# Patient Record
Sex: Female | Born: 2000 | Race: Black or African American | Hispanic: No | Marital: Single | Smoking: Never smoker
Health system: Southern US, Community
[De-identification: ages and names within clinical notes are randomized; demographics above are authoritative.]

## PROBLEM LIST (undated history)

## (undated) DIAGNOSIS — I10 Essential (primary) hypertension: Secondary | ICD-10-CM

## (undated) HISTORY — DX: Essential (primary) hypertension: I10

---
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
## 2019-05-13 ENCOUNTER — Encounter (HOSPITAL_COMMUNITY): Payer: Self-pay

## 2019-05-13 ENCOUNTER — Emergency Department (HOSPITAL_COMMUNITY)
Admission: EM | Admit: 2019-05-13 | Discharge: 2019-05-13 | Disposition: A | Discharge status: Home / Self Care | Payer: BC Managed Care – PPO | Attending: Emergency Medicine | Admitting: Emergency Medicine

## 2019-05-13 ENCOUNTER — Ambulatory Visit (INDEPENDENT_AMBULATORY_CARE_PROVIDER_SITE_OTHER)
Admission: EM | Admit: 2019-05-13 | Discharge: 2019-05-13 | Disposition: A | Discharge status: Home / Self Care | Payer: BC Managed Care – PPO | Source: Home / Self Care | Attending: Emergency Medicine | Admitting: Emergency Medicine

## 2019-05-13 ENCOUNTER — Other Ambulatory Visit: Payer: Self-pay

## 2019-05-13 DIAGNOSIS — R1031 Right lower quadrant pain: Secondary | ICD-10-CM | POA: Insufficient documentation

## 2019-05-13 DIAGNOSIS — N730 Acute parametritis and pelvic cellulitis: Secondary | ICD-10-CM | POA: Diagnosis not present

## 2019-05-13 DIAGNOSIS — Z5321 Procedure and treatment not carried out due to patient leaving prior to being seen by health care provider: Secondary | ICD-10-CM | POA: Insufficient documentation

## 2019-05-13 DIAGNOSIS — Z3202 Encounter for pregnancy test, result negative: Secondary | ICD-10-CM

## 2019-05-13 LAB — POCT URINALYSIS DIP (DEVICE)
Bilirubin Urine: NEGATIVE
Glucose, UA: NEGATIVE mg/dL
Hgb urine dipstick: NEGATIVE
Ketones, ur: NEGATIVE mg/dL
Leukocytes,Ua: NEGATIVE
Nitrite: NEGATIVE
Protein, ur: NEGATIVE mg/dL
Specific Gravity, Urine: >=1.03 (ref 1.005–1.030)
Urobilinogen, UA: 4 mg/dL — ABNORMAL HIGH (ref 0.0–1.0)
pH: 6 (ref 5.0–8.0)

## 2019-05-13 LAB — POCT PREGNANCY, URINE: Preg Test, Ur: NEGATIVE

## 2019-05-13 MED ORDER — SODIUM CHLORIDE 0.9% FLUSH: 3.0000 mL | Freq: Once | INTRAVENOUS | Status: DC

## 2019-05-13 MED ORDER — IBUPROFEN 600 MG PO TABS: 600.0000 mg | ORAL_TABLET | Freq: Four times a day (QID) | ORAL | 0 refills | Status: AC | PRN

## 2019-05-13 MED ORDER — METRONIDAZOLE 500 MG PO TABS: 500.0000 mg | ORAL_TABLET | Freq: Two times a day (BID) | ORAL | 0 refills | Status: AC

## 2019-05-13 MED ORDER — DOXYCYCLINE HYCLATE 100 MG PO CAPS: 100.0000 mg | ORAL_CAPSULE | Freq: Two times a day (BID) | ORAL | 0 refills | Status: AC

## 2019-05-13 NOTE — ED Triage Notes (Signed)
Pt states she has abdominal cramps pt states she went to [facility] ER today. Pt needs a work note.

## 2019-05-13 NOTE — Discharge Instructions (Signed)
We have sent off testing for gonorrhea, chlamydia, HIV, syphilis, trichomonas, BV and yeast.  I am treating you for chlamydia and bacterial vaginosis with the doxycycline and Flagyl.  Because you have such a severe allergic reaction with penicillins, we will need to wait and see if the gonorrhea test comes back positive, in which case you will need to be treated with Rocephin which is similar to, but is not identical to penicillin.  Y Dr. Megan Salon will see you in his office for follow-up and for lab results.  He will give you the medication if you need it and he may do allergy testing to see if you are truly allergic to cephalosporins or not.  Someone should be contacting you in the next day or so to arrange this appointment, however if you do not hear from them, call them and arrange an appointment for early next week.  You may take 600 mg ibuprofen combined with 1 g of Tylenol 3-4 times a day as needed for pain.  Immediately to the ER if your abdominal pain changes, gets worse, fevers above 100.4, for pain not controlled with a Tylenol/ibuprofen combination, or for any other concerns.

## 2019-05-13 NOTE — ED Triage Notes (Signed)
Pt arrived with right lower abdominal pain that started roughly an hour ago. States she also has a headache and nausea. States the pain started after urination.

## 2019-05-13 NOTE — ED Provider Notes (Addendum)
HPI  SUBJECTIVE:  Shirley Miller is a 18 y.o. female who presents with constant, worsening stabbing sharp right lower quadrant pain starting yesterday.  She states it radiated to the left lower quadrant last night but has re-localized to the right lower quadrant.  She reports nausea, odorous vaginal discharge.  Her last bowel movement was 2 to 3 days ago.  This is a normal stooling pattern for her.  No vomiting, fevers, anorexia, abdominal distention.  She ate dinner without any problem.  No dysuria, urgency, frequency, cloudy or odorous urine, hematuria.  No vaginal bleeding, genital rash, vaginal itching.  She is in a relatively new monogamous sexual relationship with a female who is asymptomatic, STDs are not a concern today.  She has been taking 650 mg of Tylenol and 800 mg of ibuprofen without improvement in her symptoms.  No antipyretic in the past 4 to 6 hours.  Symptoms are worse with urination and taking deep breaths.  It is not associated with eating, drinking, walking.  She states that the car ride over here was not painful.  She has a past medical history of hypertension, HSV, BV, yeast.  No history of PID, diabetes, gonorrhea, chlamydia, HIV, syphilis, trichomonas, PCOS, ovarian torsion, appendicitis.  LMP: 9/3.  She is unsure if she could be pregnant, states that her last period was irregular.  PMD: Is not in [place].  Patient was in the [place] long ER earlier today with right lower abdominal pain, headache and nausea.  Left before labs could be done.  System, Pcp Not In  Past Medical History:  Diagnosis Date  . Hypertension     History reviewed. No pertinent surgical history.  No family history on file.  Social History   Tobacco Use  . Smoking status: Never Smoker  . Smokeless tobacco: Never Used  Substance Use Topics  . Alcohol use: Never    Frequency: Never  . Drug use: Never    No current facility-administered medications for this encounter.   Current Outpatient  Medications:  .  doxycycline (VIBRAMYCIN) 100 MG capsule, Take 1 capsule (100 mg total) by mouth 2 (two) times daily for 14 days., Disp: 28 capsule, Rfl: 0 .  ibuprofen (ADVIL) 600 MG tablet, Take 1 tablet (600 mg total) by mouth every 6 (six) hours as needed., Disp: 30 tablet, Rfl: 0 .  metroNIDAZOLE (FLAGYL) 500 MG tablet, Take 1 tablet (500 mg total) by mouth 2 (two) times daily for 14 days., Disp: 28 tablet, Rfl: 0  Allergies  Allergen Reactions  . Latex Swelling  . Penicillins Swelling     ROS  As noted in HPI.   Physical Exam  BP (!) 143/92 (BP Location: Right Arm)   Pulse 66   Temp 98.1 F (36.7 C) (Temporal)   Resp 18   Wt 97.5 kg   LMP 04/19/2019   SpO2 98%   Constitutional: Well developed, well nourished, no acute distress Eyes:  EOMI, conjunctiva normal bilaterally HENT: Normocephalic, atraumatic,mucus membranes moist Respiratory: Normal inspiratory effort Cardiovascular: Normal rate GI: Normal appearance, soft, right lower quadrant tenderness, no guarding, rebound.  No masses, active bowel sounds.  Negative tap table test.  Negative Murphy.  Negative Rovsing. Back: No CVAT. GU: Normal external genitalia.  Positive thin white odorous vaginal discharge.  Normal os, normal vaginal mucosa.  Uterus smooth, nontender, no CMT.  Positive bilateral adnexal tenderness.  No adnexal masses.  Chaperone present during exam. skin: No rash, skin intact Musculoskeletal: no deformities Neurologic: Alert &  oriented x 3, no focal neuro deficits Psychiatric: Speech and behavior appropriate   ED Course   Medications - No data to display  Orders Placed This Encounter  Procedures  . RPR    Standing Status:   Standing    Number of Occurrences:   1  . HIV antibody    Standing Status:   Standing    Number of Occurrences:   1  . Pregnancy, urine POC    Standing Status:   Standing    Number of Occurrences:   1  . POCT urinalysis dip (device)    Standing Status:   Standing     Number of Occurrences:   1    Results for orders placed or performed during the hospital encounter of 05/13/19 (from the past 24 hour(s))  POCT urinalysis dip (device)     Status: Abnormal   Collection Time: 05/13/19  8:32 PM  Result Value Ref Range   Glucose, UA NEGATIVE NEGATIVE mg/dL   Bilirubin Urine NEGATIVE NEGATIVE   Ketones, ur NEGATIVE NEGATIVE mg/dL   Specific Gravity, Urine >=1.030 1.005 - 1.030   Hgb urine dipstick NEGATIVE NEGATIVE   pH 6.0 5.0 - 8.0   Protein, ur NEGATIVE NEGATIVE mg/dL   Urobilinogen, UA 4.0 (H) 0.0 - 1.0 mg/dL   Nitrite NEGATIVE NEGATIVE   Leukocytes,Ua NEGATIVE NEGATIVE  Pregnancy, urine POC     Status: None   Collection Time: 05/13/19  8:37 PM  Result Value Ref Range   Preg Test, Ur NEGATIVE NEGATIVE   No results found.  ED Clinical Impression  1. PID (acute pelvic inflammatory disease)      ED Assessment/Plan  ER records reviewed.  As noted in HPI.  Presentation concerning for PID.  She is not pregnant.  She does not have a urinary tract infection although she does have some urobilinogen in her urine dip.  In the differential is ruptured ovarian cyst, early appendicitis.  Her abdomen is benign other than the bilateral adnexal and right lower quadrant tenderness.  She has no peritoneal signs.  Doubt torsion, TOA.   Sent off gonorrhea, chlamydia, HIV, RPR, trichomonas, yeast, BV.  Discussed case with Dr. Michel Bickers, infectious disease on-call, will send her home with doxycycline and Flagyl.  She reports anaphylaxis requiring ER treatment with penicillins less than 10 years ago, so we will wait on the labs prior to seeing if she needs treatment for that.  He will follow-up with her in the infectious disease clinic early next week and administer the Rocephin and a test dose if necessary.  We will CC him on today's visit.  Home also with ibuprofen.  Gave her strict ER return precautions.  Discussed labs,  MDM, treatment plan, and plan for  follow-up with patient. Discussed sn/sx that should prompt return to the ED. patient agrees with plan.   Meds ordered this encounter  Medications  . metroNIDAZOLE (FLAGYL) 500 MG tablet    Sig: Take 1 tablet (500 mg total) by mouth 2 (two) times daily for 14 days.    Dispense:  28 tablet    Refill:  0  . doxycycline (VIBRAMYCIN) 100 MG capsule    Sig: Take 1 capsule (100 mg total) by mouth 2 (two) times daily for 14 days.    Dispense:  28 capsule    Refill:  0  . ibuprofen (ADVIL) 600 MG tablet    Sig: Take 1 tablet (600 mg total) by mouth every 6 (six) hours as needed.  Dispense:  30 tablet    Refill:  0    *This clinic note was created using Scientist, clinical (histocompatibility and immunogenetics)Dragon dictation software. Therefore, there may be occasional mistakes despite careful proofreading.   ?   Domenick GongMortenson, Shelbi Vaccaro, MD 05/13/19 2117    Domenick GongMortenson, Lauralynn Loeb, MD 05/14/19 1022

## 2019-05-13 NOTE — ED Notes (Signed)
Called pt from lobby for lab workk and recheck of V/S No response X1

## 2019-05-14 ENCOUNTER — Telehealth: Payer: Self-pay

## 2019-05-14 LAB — RPR: RPR Ser Ql: NONREACTIVE

## 2019-05-14 LAB — HIV ANTIBODY (ROUTINE TESTING W REFLEX): HIV Screen 4th Generation wRfx: NONREACTIVE

## 2019-05-14 NOTE — Telephone Encounter (Signed)
Spoke with patient and scheduled next week. Patient provided with location/phone.  Shirley Miller

## 2019-05-14 NOTE — Telephone Encounter (Signed)
-----   Message from Michel Bickers, MD sent at 05/14/2019  1:21 PM EDT ----- Please see if Ms. Shirley Miller can come see me in my new patient slot on Monday afternoon.  She was seen in the emergency room last night with possible PID.  If she cannot come on Monday please schedule her at my next available appointment.  Thanks.Jenny Reichmann

## 2019-05-14 NOTE — Telephone Encounter (Signed)
Much thanks.

## 2019-05-17 ENCOUNTER — Telehealth (HOSPITAL_COMMUNITY): Payer: Self-pay | Admitting: Emergency Medicine

## 2019-05-17 LAB — CERVICOVAGINAL ANCILLARY ONLY
Bacterial Vaginitis (gardnerella): POSITIVE — AB
Candida Glabrata: NEGATIVE
Candida Vaginitis: POSITIVE — AB
Chlamydia: NEGATIVE
Neisseria Gonorrhea: NEGATIVE
Trichomonas: NEGATIVE

## 2019-05-17 MED ORDER — FLUCONAZOLE 150 MG PO TABS: 150.0000 mg | ORAL_TABLET | Freq: Once | ORAL | 0 refills | Status: AC

## 2019-05-17 NOTE — Telephone Encounter (Signed)
Test for candida (yeast) was positive.  Prescription for fluconazole 150mg po now, repeat dose in 3d if needed, #2 no refills, sent to the pharmacy of record.  Recheck or followup with PCP for further evaluation if symptoms are not improving.    Bacterial Vaginosis test is positive.  Prescription for metronidazole was given at the urgent care visit.   Patient contacted and made aware of    results, all questions answered   

## 2019-05-21 ENCOUNTER — Encounter (HOSPITAL_COMMUNITY): Payer: Self-pay | Admitting: Emergency Medicine

## 2019-05-21 ENCOUNTER — Ambulatory Visit (INDEPENDENT_AMBULATORY_CARE_PROVIDER_SITE_OTHER)
Admission: EM | Admit: 2019-05-21 | Discharge: 2019-05-21 | Disposition: A | Discharge status: Home / Self Care | Payer: BC Managed Care – PPO | Source: Home / Self Care

## 2019-05-21 ENCOUNTER — Other Ambulatory Visit: Payer: Self-pay

## 2019-05-21 ENCOUNTER — Emergency Department (HOSPITAL_COMMUNITY)
Admission: AD | Admit: 2019-05-21 | Discharge: 2019-05-22 | Disposition: A | Discharge status: Home / Self Care | Payer: BC Managed Care – PPO | Attending: Emergency Medicine | Admitting: Emergency Medicine

## 2019-05-21 DIAGNOSIS — I10 Essential (primary) hypertension: Secondary | ICD-10-CM | POA: Diagnosis not present

## 2019-05-21 DIAGNOSIS — N83201 Unspecified ovarian cyst, right side: Secondary | ICD-10-CM | POA: Insufficient documentation

## 2019-05-21 DIAGNOSIS — R1031 Right lower quadrant pain: Secondary | ICD-10-CM

## 2019-05-21 LAB — POCT URINALYSIS DIP (DEVICE)
Bilirubin Urine: NEGATIVE
Glucose, UA: NEGATIVE mg/dL
Hgb urine dipstick: NEGATIVE
Ketones, ur: NEGATIVE mg/dL
Leukocytes,Ua: NEGATIVE
Nitrite: NEGATIVE
Protein, ur: NEGATIVE mg/dL
Specific Gravity, Urine: >=1.03 (ref 1.005–1.030)
Urobilinogen, UA: 0.2 mg/dL (ref 0.0–1.0)
pH: 6 (ref 5.0–8.0)

## 2019-05-21 LAB — COMPREHENSIVE METABOLIC PANEL
ALT: 22 U/L (ref 0–44)
AST: 19 U/L (ref 15–41)
Albumin: 3.7 g/dL (ref 3.5–5.0)
Alkaline Phosphatase: 52 U/L (ref 38–126)
Anion gap: 8 (ref 5–15)
BUN: 8 mg/dL (ref 6–20)
CO2: 23 mmol/L (ref 22–32)
Calcium: 9.5 mg/dL (ref 8.9–10.3)
Chloride: 109 mmol/L (ref 98–111)
Creatinine, Ser: 0.78 mg/dL (ref 0.44–1.00)
GFR calc Af Amer: >60 mL/min (ref >60)
GFR calc non Af Amer: >60 mL/min (ref >60)
Glucose, Bld: 97 mg/dL (ref 70–99)
Potassium: 3.9 mmol/L (ref 3.5–5.1)
Sodium: 140 mmol/L (ref 135–145)
Total Bilirubin: 0.3 mg/dL (ref 0.3–1.2)
Total Protein: 6.8 g/dL (ref 6.5–8.1)

## 2019-05-21 LAB — CBC
HCT: 38.3 % (ref 36.0–46.0)
Hemoglobin: 11.8 g/dL — ABNORMAL LOW (ref 12.0–15.0)
MCH: 25.7 pg — ABNORMAL LOW (ref 26.0–34.0)
MCHC: 30.8 g/dL (ref 30.0–36.0)
MCV: 83.4 fL (ref 80.0–100.0)
Platelets: 257 10*3/uL (ref 150–400)
RBC: 4.59 MIL/uL (ref 3.87–5.11)
RDW: 13.7 % (ref 11.5–15.5)
WBC: 5.6 10*3/uL (ref 4.0–10.5)
nRBC: 0 % (ref 0.0–0.2)

## 2019-05-21 LAB — I-STAT BETA HCG BLOOD, ED (MC, WL, AP ONLY): I-stat hCG, quantitative: <5 m[IU]/mL (ref <5)

## 2019-05-21 LAB — POCT PREGNANCY, URINE: Preg Test, Ur: NEGATIVE

## 2019-05-21 LAB — LIPASE, BLOOD: Lipase: 41 U/L (ref 11–51)

## 2019-05-21 MED ORDER — SODIUM CHLORIDE 0.9% FLUSH: 3.0000 mL | Freq: Once | INTRAVENOUS | Status: DC

## 2019-05-21 NOTE — Discharge Instructions (Addendum)
Go to ER for further evaluation of abdominal pain.

## 2019-05-21 NOTE — ED Triage Notes (Addendum)
C/o lower abd pain x 1 1/2 weeks with nausea.  Pt was sent from [facility] to MAU and then to ED after urine preg test was negative.

## 2019-05-21 NOTE — MAU Note (Signed)
PT SAYS SHE HAS ABD PAIN - X1-2 WEEKS .  TOLD TO GO TO ER.  TOOK IBUPROFEN FOR PAIN  DENIES FEVER - NO BLEEDING .

## 2019-05-21 NOTE — ED Triage Notes (Addendum)
Pt is here for continued lower abdominal cramping.  She was seen here 8 days ago and was prescribed several medications that she states she is taking as prescribed and still has about 5 days left.  She states the pain is not getting any better.  Pt requesting another pregnancy test today.

## 2019-05-21 NOTE — MAU Provider Note (Signed)
First Provider Initiated Contact with Patient 05/21/19 1934      S Ms. Shirley Miller is a 18 y.o. [id number] non-pregnant female who presents to MAU today with complaint of lower abdominal pain x 1.5 weeks. She was seen at Urgent care earlier today and had a negative UPT and was told to seek further evaluation in the ED and presented here. She denies vaginal bleeding or fever. She was just recently treated for PID.    O BP 129/71 (BP Location: Right Arm)   Pulse 83   Temp 97.6 F (36.4 C) (Oral)   Resp 20   Ht 5\' 6"  (1.676 m)   Wt 116.3 kg   LMP 04/15/2019   BMI 41.37 kg/m  Physical Exam  Nursing note and vitals reviewed. Constitutional: She is oriented to person, place, and time. She appears well-developed and well-nourished. No distress.  HENT:  Head: Normocephalic and atraumatic.  Cardiovascular: Normal rate.  Respiratory: Effort normal.  GI: Soft. She exhibits no distension.  Neurological: She is alert and oriented to person, place, and time.  Skin: Skin is warm and dry. No erythema.  Psychiatric: She has a normal mood and affect.   Results for orders placed or performed during the hospital encounter of 05/21/19 (from the past 24 hour(s))  POCT urinalysis dip (device)     Status: None   Collection Time: 05/21/19  1:43 PM  Result Value Ref Range   Glucose, UA NEGATIVE NEGATIVE mg/dL   Bilirubin Urine NEGATIVE NEGATIVE   Ketones, ur NEGATIVE NEGATIVE mg/dL   Specific Gravity, Urine >=1.030 1.005 - 1.030   Hgb urine dipstick NEGATIVE NEGATIVE   pH 6.0 5.0 - 8.0   Protein, ur NEGATIVE NEGATIVE mg/dL   Urobilinogen, UA 0.2 0.0 - 1.0 mg/dL   Nitrite NEGATIVE NEGATIVE   Leukocytes,Ua NEGATIVE NEGATIVE  Pregnancy, urine POC     Status: None   Collection Time: 05/21/19  1:56 PM  Result Value Ref Range   Preg Test, Ur NEGATIVE NEGATIVE    A Non pregnant female Medical screening exam started  Abdominal pain in a non-pregnant female   P Transfer to [facility] for further  evaluation RN has called report Dr. Blair Hailey was advised of the patient and accepts the transfer   Danielle Rankin 05/21/2019 7:36 PM

## 2019-05-21 NOTE — ED Notes (Signed)
Pt has to go get her boyfriend from work, will be back later.

## 2019-05-21 NOTE — ED Provider Notes (Signed)
[facility]    CSN: [id number] Arrival date & time: 05/21/19  1250      History   Chief Complaint Chief Complaint  Patient presents with  . Abdominal Pain    HPI Shirley Miller is a 18 y.o. female.   Patient here concerned with abdominal pain x ~1 week. She was seen here 10/1, previous chart reviewed.  She was treated for PID, given doxycycline and flagyl, which she continues to take.  Lab positive for BV and yeast infection, negative for GC/CT/trich.  She reports pain worsened over last week, 10/10 pain located primarily RLQ.  LMP > 4 weeks ago, patient unsure if she is pregnant.  She admits 1 episode of vomiting x yesterday, notes she's had "loose" stools. Denies dysuria, frequency, urgency, vaginal bleeding.  She notes vaginal d/c is improving since starting medication.       Past Medical History:  Diagnosis Date  . Hypertension     There are no active problems to display for this patient.   History reviewed. No pertinent surgical history.  OB History    Gravida  0   Para  0   Term  0   Preterm  0   AB  0   Living  0     SAB  0   TAB  0   Ectopic  0   Multiple  0   Live Births  0            Home Medications    Prior to Admission medications   Medication Sig Start Date End Date Taking? Authorizing Provider  doxycycline (VIBRAMYCIN) 100 MG capsule Take 1 capsule (100 mg total) by mouth 2 (two) times daily for 14 days. 05/13/19 05/27/19 Yes Domenick Gong, MD  ibuprofen (ADVIL) 600 MG tablet Take 1 tablet (600 mg total) by mouth every 6 (six) hours as needed. 05/13/19  Yes Domenick Gong, MD  metroNIDAZOLE (FLAGYL) 500 MG tablet Take 1 tablet (500 mg total) by mouth 2 (two) times daily for 14 days. 05/13/19 05/27/19 Yes Domenick Gong, MD    Family History History reviewed. No pertinent family history.  Social History Social History   Tobacco Use  . Smoking status: Never Smoker  . Smokeless tobacco: Never Used   Substance Use Topics  . Alcohol use: Never    Frequency: Never  . Drug use: Never     Allergies   Latex and Penicillins   Review of Systems Review of Systems  Constitutional: Negative for chills, fatigue and fever.  Gastrointestinal: Positive for abdominal pain, diarrhea, nausea and vomiting. Negative for abdominal distention, blood in stool and constipation.  Genitourinary: Positive for vaginal discharge. Negative for difficulty urinating, dysuria, flank pain, frequency, hematuria, pelvic pain, urgency, vaginal bleeding and vaginal pain.  Musculoskeletal: Negative for arthralgias, back pain and myalgias.  Skin: Negative for rash.  Allergic/Immunologic: Negative for immunocompromised state.  Neurological: Negative for light-headedness and headaches.  Hematological: Negative for adenopathy. Does not bruise/bleed easily.  Psychiatric/Behavioral: Positive for sleep disturbance. Negative for confusion.     Physical Exam Triage Vital Signs ED Triage Vitals  Enc Vitals Group     BP 05/21/19 1312 122/60     Pulse --      Resp 05/21/19 1312 16     Temp 05/21/19 1312 98.3 F (36.8 C)     Temp Source 05/21/19 1312 Oral     SpO2 05/21/19 1312 100 %     Weight --  Height --      Head Circumference --      Peak Flow --      Pain Score 05/21/19 1313 10     Pain Loc --      Pain Edu? --      Excl. in [place]? --    No data found.  Updated Vital Signs BP 122/60 (BP Location: Right Arm)   Temp 98.3 F (36.8 C) (Oral)   Resp 16   LMP 04/15/2019 (Exact Date)   SpO2 100%   Visual Acuity Right Eye Distance:   Left Eye Distance:   Bilateral Distance:    Right Eye Near:   Left Eye Near:    Bilateral Near:     Physical Exam Vitals signs and nursing note reviewed.  Constitutional:      General: She is not in acute distress.    Appearance: She is well-developed. She is not ill-appearing or toxic-appearing.  HENT:     Head: Normocephalic and atraumatic.  Eyes:      Conjunctiva/sclera: Conjunctivae normal.  Neck:     Musculoskeletal: Neck supple.  Cardiovascular:     Rate and Rhythm: Normal rate and regular rhythm.     Heart sounds: No murmur.  Pulmonary:     Effort: Pulmonary effort is normal. No respiratory distress.     Breath sounds: Normal breath sounds.  Abdominal:     General: Bowel sounds are normal. There is no distension.     Palpations: Abdomen is soft. There is no mass.     Tenderness: There is generalized abdominal tenderness and tenderness in the right lower quadrant. There is no right CVA tenderness, left CVA tenderness or guarding. Negative signs include Rovsing's sign.  Skin:    General: Skin is warm and dry.     Capillary Refill: Capillary refill takes less than 2 seconds.  Neurological:     General: No focal deficit present.     Mental Status: She is alert and oriented to person, place, and time.     Cranial Nerves: No cranial nerve deficit.  Psychiatric:        Mood and Affect: Mood normal.        Behavior: Behavior normal.      UC Treatments / Results  Labs (all labs ordered are listed, but only abnormal results are displayed) Labs Reviewed  POCT URINALYSIS DIP (DEVICE)  POCT PREGNANCY, URINE    EKG   Radiology No results found.  Procedures Procedures (including critical care time)  Medications Ordered in UC Medications - No data to display  Initial Impression / Assessment and Plan / UC Course  I have reviewed the triage vital signs and the nursing notes.  Pertinent labs & imaging results that were available during my care of the patient were reviewed by me and considered in my medical decision making (see chart for details).    Labs reviewed from today and 1 week ago. Given worsening pain, RLQ tenderness despite treatment, sent to ER for further evaluation.  Final Clinical Impressions(s) / UC Diagnoses   Final diagnoses:  Right lower quadrant abdominal pain     Discharge Instructions     Go  to ER for further evaluation of abdominal pain.     ED Prescriptions    None     PDMP not reviewed this encounter.   Peri Jefferson, PA-C 05/21/19 1431

## 2019-05-22 ENCOUNTER — Emergency Department (HOSPITAL_COMMUNITY): Payer: BC Managed Care – PPO

## 2019-05-22 DIAGNOSIS — N83201 Unspecified ovarian cyst, right side: Secondary | ICD-10-CM | POA: Diagnosis not present

## 2019-05-22 LAB — URINALYSIS, ROUTINE W REFLEX MICROSCOPIC
Bilirubin Urine: NEGATIVE
Glucose, UA: NEGATIVE mg/dL
Hgb urine dipstick: NEGATIVE
Ketones, ur: NEGATIVE mg/dL
Leukocytes,Ua: NEGATIVE
Nitrite: NEGATIVE
Protein, ur: NEGATIVE mg/dL
Specific Gravity, Urine: 1.021 (ref 1.005–1.030)
pH: 6 (ref 5.0–8.0)

## 2019-05-22 MED ORDER — IOHEXOL 300 MG/ML  SOLN
125.0000 mL | Freq: Once | INTRAMUSCULAR | Status: AC | PRN
  Administered 2019-05-22: 125 mL via INTRAVENOUS

## 2019-05-22 MED ORDER — FENTANYL CITRATE (PF) 100 MCG/2ML IJ SOLN
50.0000 ug | Freq: Once | INTRAMUSCULAR | Status: AC
  Administered 2019-05-22: 50 ug via INTRAVENOUS
  Filled 2019-05-22: qty 2

## 2019-05-22 MED ORDER — ONDANSETRON HCL 4 MG/2ML IJ SOLN
4.0000 mg | Freq: Once | INTRAMUSCULAR | Status: AC
  Administered 2019-05-22: 04:00:00 4 mg via INTRAVENOUS
  Filled 2019-05-22: qty 2

## 2019-05-22 NOTE — ED Provider Notes (Signed)
Emergency Department Provider Note   I have reviewed the triage vital signs and the nursing notes.   HISTORY  Chief Complaint Abdominal Pain   HPI Shirley Miller is a 18 y.o. female who presents the emerge department today with right lower quadrant abdominal pain.  She has had this pain for couple weeks now.  Patient's been seen by urgent care and her primary doctor and been tested for STDs and found to be negative.  Persistent pain at urgent care today in the right lower quadrant assistant here for further evaluation.  Patient without any other associated symptoms.  No nausea, vomiting, diarrhea or constipation.  No urinary changes.  She does have some vaginal discharge still but it is no different than it was a couple weeks ago.     No other associated or modifying symptoms.    Past Medical History:  Diagnosis Date  . Hypertension     There are no active problems to display for this patient.   History reviewed. No pertinent surgical history.  Current Outpatient Rx  . Order #: [id number] Class: Normal  . Order #: [id number] Class: Normal  . Order #: [id number] Class: Normal    Allergies Latex and Penicillins  No family history on file.  Social History Social History   Tobacco Use  . Smoking status: Never Smoker  . Smokeless tobacco: Never Used  Substance Use Topics  . Alcohol use: Never    Frequency: Never  . Drug use: Never    Review of Systems  All other systems negative except as documented in the HPI. All pertinent positives and negatives as reviewed in the HPI. ____________________________________________   PHYSICAL EXAM:  VITAL SIGNS: ED Triage Vitals  Enc Vitals Group     BP 05/21/19 1932 129/71     Pulse Rate 05/21/19 1932 83     Resp 05/21/19 1932 20     Temp 05/21/19 1932 97.6 F (36.4 C)     Temp Source 05/21/19 1932 Oral     SpO2 05/21/19 2128 100 %     Weight 05/21/19 1932 256 lb 4.8 oz (116.3 kg)     Height 05/21/19 1932 5\' 6"   (1.676 m)    Constitutional: Alert and oriented. Well appearing and in no acute distress. Eyes: Conjunctivae are normal. PERRL. EOMI. Head: Atraumatic. Nose: No congestion/rhinnorhea. Mouth/Throat: Mucous membranes are moist.  Oropharynx non-erythematous. Neck: No stridor.  No meningeal signs.   Cardiovascular: Normal rate, regular rhythm. Good peripheral circulation. Grossly normal heart sounds.   Respiratory: Normal respiratory effort.  No retractions. Lungs CTAB. Gastrointestinal: Soft and RLQ ttp without rebound or guarding. No distention.  Musculoskeletal: No lower extremity tenderness nor edema. No gross deformities of extremities. Neurologic:  Normal speech and language. No gross focal neurologic deficits are appreciated.  Skin:  Skin is warm, dry and intact. No rash noted.  ____________________________________________   LABS (all labs ordered are listed, but only abnormal results are displayed)  Labs Reviewed  CBC - Abnormal; Notable for the following components:      Result Value   Hemoglobin 11.8 (*)    MCH 25.7 (*)    All other components within normal limits  URINALYSIS, ROUTINE W REFLEX MICROSCOPIC - Abnormal; Notable for the following components:   APPearance HAZY (*)    All other components within normal limits  LIPASE, BLOOD  COMPREHENSIVE METABOLIC PANEL  I-STAT BETA HCG BLOOD, ED ([facility], [facility], [facility] ONLY)   ____________________________________________  EKG   EKG Interpretation  Date/Time:  Ventricular Rate:    PR Interval:    QRS Duration:   QT Interval:    QTC Calculation:   R Axis:     Text Interpretation:         ____________________________________________  RADIOLOGY  Ct Abdomen Pelvis W Contrast  Result Date: 05/22/2019 CLINICAL DATA:  Abdominal pain. Acute abdominal pain. RIGHT flank pain with nausea EXAM: CT ABDOMEN AND PELVIS WITH CONTRAST TECHNIQUE: Multidetector CT imaging of the abdomen and pelvis was performed using the standard  protocol following bolus administration of intravenous contrast. CONTRAST:  140mL OMNIPAQUE IOHEXOL 300 MG/ML  SOLN COMPARISON:  None. FINDINGS: Lower chest: Lung bases are clear. Hepatobiliary: No focal hepatic lesion. No biliary duct dilatation. Gallbladder is normal. Common bile duct is normal. Pancreas: Pancreas is normal. No ductal dilatation. No pancreatic inflammation. Spleen: Normal spleen Adrenals/urinary tract: Adrenal glands and kidneys are normal. The ureters and bladder normal. Stomach/Bowel: Stomach, duodenum small-bowel normal. Appendix is gas-filled extending from the cecal tip measuring 4 mm in diameter. No wall thickening. The appendix is out line by a small amount of fluid in the RIGHT lower quadrant (image 75/3). Ascending, transverse and descending colon normal. Vascular/Lymphatic: Abdominal aorta is normal caliber. No periportal or retroperitoneal adenopathy. No pelvic adenopathy. Reproductive: Uterus is normal. Large cyst in the RIGHT ovary measuring 4.0 x 4.0 cm. Small amount free fluid the pelvis. Small amount of free fluid in the RIGHT lower quadrant. LEFT ovary is normal Other: Small free fluid in the RIGHT lower quadrant Musculoskeletal: No aggressive osseous lesion. IMPRESSION: 1. Small amount of free fluid in the RIGHT lower quadrant and pelvis is likely physiologic. 2. Large RIGHT ovarian cyst.  This could be a source of pain. 3. Appendix is out line by a small amount of fluid in the RIGHT lower quadrant. No wall thickening or inflammation. 4. No nephrolithiasis or ureterolithiasis. Electronically Signed   By: Suzy Bouchard M.D.   On: 05/22/2019 05:12    ____________________________________________   PROCEDURES  Procedure(s) performed:   Procedures   ____________________________________________   INITIAL IMPRESSION / ASSESSMENT AND PLAN / ED COURSE  Ct with cyst, will eval for torsion with [place]. Doubt std/PID with negative studies previously, no need for repeat.    [place] w/ cyst, no torsion. Dc for gyn follow up.  Pertinent labs & imaging results that were available during my care of the patient were reviewed by me and considered in my medical decision making (see chart for details).   A medical screening exam was performed and I feel the patient has had an appropriate workup for their chief complaint at this time and likelihood of emergent condition existing is low. They have been counseled on decision, discharge, follow up and which symptoms necessitate immediate return to the emergency department. They or their family verbally stated understanding and agreement with plan and discharged in stable condition.   ____________________________________________  FINAL CLINICAL IMPRESSION(S) / ED DIAGNOSES  Final diagnoses:  RLQ abdominal pain  RLQ abdominal pain     MEDICATIONS GIVEN DURING THIS VISIT:  Medications  sodium chloride flush (NS) 0.9 % injection 3 mL (3 mLs Intravenous Not Given 05/22/19 0319)  fentaNYL (SUBLIMAZE) injection 50 mcg (50 mcg Intravenous Given 05/22/19 0335)  ondansetron (ZOFRAN) injection 4 mg (4 mg Intravenous Given 05/22/19 0335)  iohexol (OMNIPAQUE) 300 MG/ML solution 125 mL (125 mLs Intravenous Contrast Given 05/22/19 0419)     NEW OUTPATIENT MEDICATIONS STARTED DURING THIS VISIT:  New Prescriptions   No medications on file  Note:  This note was prepared with assistance of Dragon voice recognition software. Occasional wrong-word or sound-a-like substitutions may have occurred due to the inherent limitations of voice recognition software.   Jeb Schloemer, Barbara CowerJason, MD 05/22/19 [phone]

## 2019-05-31 ENCOUNTER — Telehealth: Payer: Self-pay

## 2019-05-31 NOTE — Telephone Encounter (Signed)
COVID-19 Pre-Screening Questions:05/31/19   Do you currently have a fever (>100 F), chills or unexplained body aches? NO  Are you currently experiencing new cough, shortness of breath, sore throat, runny nose? *NO .  Have you recently travelled outside the state of [place] in the last 14 days?NO .  Have you been in contact with someone that is currently pending confirmation of Covid19 testing or has been confirmed to have the Covid19 virus?  NO  **If the patient answers NO to ALL questions -  advise the patient to please call the clinic before coming to the office should any symptoms develop.     

## 2019-06-01 ENCOUNTER — Encounter: Payer: Self-pay | Admitting: Internal Medicine

## 2019-06-01 ENCOUNTER — Other Ambulatory Visit: Payer: Self-pay

## 2019-06-01 ENCOUNTER — Ambulatory Visit (INDEPENDENT_AMBULATORY_CARE_PROVIDER_SITE_OTHER): Payer: BC Managed Care – PPO | Admitting: Internal Medicine

## 2019-06-01 DIAGNOSIS — N760 Acute vaginitis: Secondary | ICD-10-CM

## 2019-06-01 DIAGNOSIS — N83209 Unspecified ovarian cyst, unspecified side: Secondary | ICD-10-CM | POA: Insufficient documentation

## 2019-06-01 DIAGNOSIS — N83201 Unspecified ovarian cyst, right side: Secondary | ICD-10-CM

## 2019-06-01 MED ORDER — FLUCONAZOLE 100 MG PO TABS: 100.0000 mg | ORAL_TABLET | Freq: Every day | ORAL | 0 refills | Status: AC

## 2019-06-01 NOTE — Assessment & Plan Note (Signed)
I suspect that her abdominal pain is due to her ovarian cyst.  I will make a referral back to her gynecologist, Fatima Blank CNM.

## 2019-06-01 NOTE — Progress Notes (Signed)
[facility]  Reason for Consult: Recurrent vaginitis and right ovarian cyst Referring Provider: Dr. Domenick Gong  Assessment: She has had a history of recurrent candidal and bacterial vaginosis.  I told her that sexual activity is developed to be a clear risk factor for this.  I will treat her with a second round of oral fluconazole for her current symptoms.  I am concerned that she is not on any birth control.  We were able to get her a supply of nonlatex condoms and told her that she can come back here if she needs more in the future. I will make a referral back to her gynecologist, Shirley Miller CNM, to discuss options for birth control.  I suspect that her abdominal pain is due to her ovarian cyst.  I will make a referral back to her gynecologist, Shirley Miller CNM.  Plan: 1. Fluconazole 2. Gyn referral   Patient Active Problem List   Diagnosis Date Noted  . 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 Ovarian cyst 06/01/2019    Patient's Medications  New Prescriptions   FLUCONAZOLE (DIFLUCAN) 100 MG TABLET    Take 1 tablet (100 mg total) by mouth daily.  Previous Medications   IBUPROFEN (ADVIL) 600 MG TABLET    Take 1 tablet (600 mg total) by mouth every 6 (six) hours as needed.  Modified Medications   No medications on file  Discontinued Medications   No medications on file    HPI: Shirley Miller is a 18 y.o. female student at [employer] with a history of recurrent vaginitis.  She developed a malodorous vaginal discharge, itching and right lower quadrant pain in late September and came to the emergency department on 05/13/2019.  She was diagnosed with Candida and bacterial vaginosis.  Because of concern for PID she was treated with a combination of doxycycline, metronidazole and fluconazole.  Testing for GC, chlamydia, syphilis and HIV were all negative.  She completed her therapies and felt much better except  has had some lingering right lower quadrant pain that occasionally radiates to her left side.  She was seen back in the ED on 05/22/2019.  CT scan showed a large right ovarian cyst with a small amount of free fluid in the pelvis.  This was confirmed with a pelvic ultrasound.  The ED note states that a gynecology referral was placed but apparently that never happened.  She has continued to have some intermittent right lower quadrant pain.  Several days ago she started having slight amount of thin vaginal discharge with slight itching.  There is no odor.  She has been sexually active.  She has 7 lifetime female partners.  She has had only one partner in the past 6 months.  She has been sexually active since onset of her current pain.  She used to receive Depo-Medrol injections but stopped that a year and a half ago.  She said that it was "messing" with her health and raising her blood pressure.  She is currently not on any form of birth control.  Her partner does not use condoms because she develops vaginal itching and swelling when using regular condoms.  Review of Systems: Review of Systems  Constitutional: Negative for chills, diaphoresis and fever.  Respiratory: Negative for cough and shortness of breath.   Cardiovascular: Negative for chest pain.  Gastrointestinal: Positive for abdominal pain. Negative for constipation, diarrhea, heartburn, nausea and vomiting.  Genitourinary: Negative for  dysuria.      Past Medical History:  Diagnosis Date  . Hypertension     Social History   Tobacco Use  . Smoking status: Never Smoker  . Smokeless tobacco: Never Used  Substance Use Topics  . Alcohol use: Never    Frequency: Never  . Drug use: Never    No family history on file. Allergies  Allergen Reactions  . Latex Swelling  . Penicillins Swelling    OBJECTIVE: Vitals:   06/01/19 1133  Weight: 241 lb (109.3 kg)   Body mass index is 38.9 kg/m.   Physical Exam Constitutional:       Comments: She is in good spirits.  Cardiovascular:     Rate and Rhythm: Normal rate and regular rhythm.     Heart sounds: No murmur.  Pulmonary:     Effort: Pulmonary effort is normal.     Breath sounds: Normal breath sounds.  Abdominal:     Palpations: Abdomen is soft. There is no mass.     Tenderness: There is abdominal tenderness. There is no guarding or rebound.  Psychiatric:        Mood and Affect: Mood normal.     Microbiology: No results found for this or any previous visit (from the past 240 hour(s)).  Michel Bickers, MD [facility] [phone] pager   [phone] cell 06/01/2019, 12:09 PM

## 2019-06-01 NOTE — Assessment & Plan Note (Addendum)
She has had a history of recurrent candidal and bacterial vaginosis.  I told her that sexual activity is developed to be a clear risk factor for this.  I will treat her with a second round of oral fluconazole for her current symptoms.  I am concerned that she is not on any birth control.  We were able to get her a supply of nonlatex condoms and told her that she can come back here if she needs more in the future. I will make a referral back to her gynecologist, Fatima Blank CNM, to discuss options for birth control.

## 2019-06-21 ENCOUNTER — Ambulatory Visit: Payer: BC Managed Care – PPO | Admitting: Family Medicine

## 2019-06-28 ENCOUNTER — Telehealth: Payer: Self-pay | Admitting: Licensed Clinical Social Worker

## 2019-06-28 NOTE — Telephone Encounter (Signed)
Called patient to reschedule no show visit. Unable to leave voicemail.

## 2020-05-10 ENCOUNTER — Other Ambulatory Visit: Payer: Self-pay

## 2020-05-10 ENCOUNTER — Ambulatory Visit (HOSPITAL_COMMUNITY)
Admission: EM | Admit: 2020-05-10 | Discharge: 2020-05-10 | Disposition: A | Discharge status: Home / Self Care | Payer: BC Managed Care – PPO | Attending: Family Medicine | Admitting: Family Medicine

## 2020-05-10 ENCOUNTER — Encounter (HOSPITAL_COMMUNITY): Payer: Self-pay | Admitting: Emergency Medicine

## 2020-05-10 DIAGNOSIS — R1909 Other intra-abdominal and pelvic swelling, mass and lump: Secondary | ICD-10-CM | POA: Insufficient documentation

## 2020-05-10 DIAGNOSIS — Z880 Allergy status to penicillin: Secondary | ICD-10-CM | POA: Insufficient documentation

## 2020-05-10 DIAGNOSIS — Z3202 Encounter for pregnancy test, result negative: Secondary | ICD-10-CM | POA: Diagnosis not present

## 2020-05-10 DIAGNOSIS — I10 Essential (primary) hypertension: Secondary | ICD-10-CM | POA: Diagnosis not present

## 2020-05-10 DIAGNOSIS — N898 Other specified noninflammatory disorders of vagina: Secondary | ICD-10-CM | POA: Diagnosis not present

## 2020-05-10 DIAGNOSIS — R590 Localized enlarged lymph nodes: Secondary | ICD-10-CM | POA: Diagnosis not present

## 2020-05-10 LAB — POCT URINALYSIS DIPSTICK, ED / UC
Bilirubin Urine: NEGATIVE
Glucose, UA: NEGATIVE mg/dL
Hgb urine dipstick: NEGATIVE
Ketones, ur: NEGATIVE mg/dL
Leukocytes,Ua: NEGATIVE
Nitrite: NEGATIVE
Protein, ur: NEGATIVE mg/dL
Specific Gravity, Urine: >=1.03 (ref 1.005–1.030)
Urobilinogen, UA: 1 mg/dL (ref 0.0–1.0)
pH: 6 (ref 5.0–8.0)

## 2020-05-10 LAB — HIV ANTIBODY (ROUTINE TESTING W REFLEX): HIV Screen 4th Generation wRfx: NONREACTIVE

## 2020-05-10 LAB — POC URINE PREG, ED: Preg Test, Ur: NEGATIVE

## 2020-05-10 NOTE — Discharge Instructions (Signed)
Follow up with your OBGYN or Primary Care provider if your pain persists

## 2020-05-10 NOTE — ED Triage Notes (Signed)
Pt presents with groin pain xs 2-3 day, states feels lump. C/o of vaginal discharge xs 1-2 days.

## 2020-05-10 NOTE — ED Provider Notes (Signed)
[facility]    CSN: [id number] Arrival date & time: 05/10/20  1557      History   Chief Complaint Chief Complaint  Patient presents with  . Groin Swelling    HPI Surena Effie Wahlert is a 19 y.o. female.   Presenting today with 3 days of left groin pain and lump as well as some mild vaginal discharge. Denies abdominal pain, N/V/D, fever, dysuria, hematuria, hx of hernias or lymphadenopathy. States she recently got a new sexual partner but unaware of any exposures to STIs. Does have a pmhx of ovarian cyst on this side but states this feels very different. Has tried tylenol without benefit.      Past Medical History:  Diagnosis Date  . Hypertension     Patient Active Problem List   Diagnosis Date Noted  . 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 Ovarian cyst 06/01/2019    History reviewed. No pertinent surgical history.  OB History    Gravida  0   Para  0   Term  0   Preterm  0   AB  0   Living  0     SAB  0   TAB  0   Ectopic  0   Multiple  0   Live Births  0            Home Medications    Prior to Admission medications   Medication Sig Start Date End Date Taking? Authorizing Provider  ESTARYLLA 0.25-35 MG-MCG tablet Take 1 tablet by mouth daily. 04/25/20   [provider]  fluconazole (DIFLUCAN) 100 MG tablet Take 1 tablet (100 mg total) by mouth daily. 06/01/19   Cliffton Asters, MD  ibuprofen (ADVIL) 600 MG tablet Take 1 tablet (600 mg total) by mouth every 6 (six) hours as needed. 05/13/19   Domenick Gong, MD    Family History History reviewed. No pertinent family history.  Social History Social History   Tobacco Use  . Smoking status: Never Smoker  . Smokeless tobacco: Never Used  Substance Use Topics  . Alcohol use: Never  . Drug use: Never     Allergies   Latex and Penicillins   Review of Systems Review of Systems PER HPI   Physical Exam Triage Vital Signs ED Triage Vitals  Enc Vitals Group      BP 05/10/20 1817 123/79     Pulse Rate 05/10/20 1817 78     Resp 05/10/20 1817 16     Temp 05/10/20 1817 98.5 F (36.9 C)     Temp Source 05/10/20 1817 Oral     SpO2 05/10/20 1817 99 %     Weight --      Height --      Head Circumference --      Peak Flow --      Pain Score 05/10/20 1816 2     Pain Loc --      Pain Edu? --      Excl. in GC? --    No data found.  Updated Vital Signs BP 123/79 (BP Location: Right Arm)   Pulse 78   Temp 98.5 F (36.9 C) (Oral)   Resp 16   LMP 04/18/2020   SpO2 99%   Visual Acuity Right Eye Distance:   Left Eye Distance:   Bilateral Distance:    Right Eye Near:   Left Eye Near:    Bilateral Near:     Physical Exam Vitals and nursing note reviewed. Exam  conducted with a chaperone present.  Constitutional:      Appearance: Normal appearance. She is not ill-appearing.  HENT:     Head: Atraumatic.     Nose: Nose normal.     Mouth/Throat:     Mouth: Mucous membranes are moist.     Pharynx: Oropharynx is clear.  Eyes:     Extraocular Movements: Extraocular movements intact.     Conjunctiva/sclera: Conjunctivae normal.  Cardiovascular:     Rate and Rhythm: Normal rate and regular rhythm.     Heart sounds: Normal heart sounds.  Pulmonary:     Effort: Pulmonary effort is normal.     Breath sounds: Normal breath sounds.  Abdominal:     General: Bowel sounds are normal. There is no distension.     Palpations: Abdomen is soft.     Tenderness: There is no abdominal tenderness. There is no right CVA tenderness, left CVA tenderness or guarding.  Genitourinary:    Vagina: No vaginal discharge.    Musculoskeletal:        General: Normal range of motion.     Cervical back: Normal range of motion and neck supple.  Skin:    General: Skin is warm and dry.  Neurological:     Mental Status: She is alert and oriented to person, place, and time.  Psychiatric:        Mood and Affect: Mood normal.        Thought Content: Thought content  normal.        Judgment: Judgment normal.    UC Treatments / Results  Labs (all labs ordered are listed, but only abnormal results are displayed) Labs Reviewed  RPR  HIV ANTIBODY (ROUTINE TESTING W REFLEX)  POC URINE PREG, ED  POCT URINALYSIS DIPSTICK, ED / UC  CERVICOVAGINAL ANCILLARY ONLY    EKG   Radiology No results found.  Procedures Procedures (including critical care time)  Medications Ordered in UC Medications - No data to display  Initial Impression / Assessment and Plan / UC Course  I have reviewed the triage vital signs and the nursing notes.  Pertinent labs & imaging results that were available during my care of the patient were reviewed by me and considered in my medical decision making (see chart for details).     U/A benign, urine preg neg, vitals and overall exam reassuring. Suspect inflammatory vs infectious lymphadenopathy. Reassurance given, STI panel and aptima swab pending. Warm compresses, NSAIDs, and f/u with GYN or PCP recommended if not resolving in next few weeks.    Final Clinical Impressions(s) / UC Diagnoses   Final diagnoses:  Inguinal lymphadenopathy     Discharge Instructions     Follow up with your OBGYN or Primary Care provider if your pain persists    ED Prescriptions    None     PDMP not reviewed this encounter.   Particia Nearing, [place] 05/10/20 1919

## 2020-05-11 LAB — RPR: RPR Ser Ql: NONREACTIVE

## 2020-05-11 LAB — CERVICOVAGINAL ANCILLARY ONLY
Bacterial Vaginitis (gardnerella): NEGATIVE
Candida Glabrata: NEGATIVE
Candida Vaginitis: NEGATIVE
Chlamydia: NEGATIVE
Comment: NEGATIVE
Comment: NEGATIVE
Comment: NEGATIVE
Comment: NEGATIVE
Comment: NEGATIVE
Comment: NORMAL
Neisseria Gonorrhea: POSITIVE — AB
Trichomonas: NEGATIVE

## 2020-05-12 ENCOUNTER — Telehealth (HOSPITAL_COMMUNITY): Payer: Self-pay | Admitting: Emergency Medicine

## 2020-05-12 NOTE — Telephone Encounter (Signed)
Per protocol, patient will need treatment with Rocephin IM in clinic.  Patient called and made aware of results.  Encouraged her to return during my office hours 8-4, states she thinks she will need to come Saturday.  Patient tearful on phone, states she doesn't understand how she can have another STD when she hasn't had sex since June.  This RN unable to answer all of her questions, and encouraged her to discuss with a provider and do a full visit if she still has concerns.  Patient verbalized understanding.

## 2020-05-13 ENCOUNTER — Other Ambulatory Visit: Payer: Self-pay

## 2020-05-13 ENCOUNTER — Ambulatory Visit (HOSPITAL_COMMUNITY)
Admission: EM | Admit: 2020-05-13 | Discharge: 2020-05-13 | Disposition: A | Discharge status: Home / Self Care | Payer: BC Managed Care – PPO | Attending: Family Medicine | Admitting: Family Medicine

## 2020-05-13 DIAGNOSIS — A549 Gonococcal infection, unspecified: Secondary | ICD-10-CM | POA: Diagnosis not present

## 2020-05-13 MED ORDER — CEFTRIAXONE SODIUM 500 MG IJ SOLR
INTRAMUSCULAR | Status: AC
  Filled 2020-05-13: qty 500

## 2020-05-13 MED ORDER — CEFTRIAXONE SODIUM 500 MG IJ SOLR
500.0000 mg | Freq: Once | INTRAMUSCULAR | Status: AC
  Administered 2020-05-13: 500 mg via INTRAMUSCULAR

## 2020-05-13 NOTE — ED Triage Notes (Signed)
Pt presents for STD Treatment of 500 mg of rocephin: consulted with provider and treatment was ordered according to mild allergy to penicillin.

## 2020-06-30 IMAGING — CT CT ABD-PELV W/ CM
1 series · 9 of 34 positions shown, 11 images · IV contrast (omnipaque)
Comparison: None.

CLINICAL DATA: Abdominal pain. Acute abdominal pain. RIGHT flank
pain with nausea

EXAM:
CT ABDOMEN AND PELVIS WITH CONTRAST
TECHNIQUE: Multidetector CT imaging of the abdomen and pelvis was performed
using the standard protocol following bolus administration of
intravenous contrast.
CONTRAST:  125mL OMNIPAQUE IOHEXOL 300 MG/ML  SOLN

[Series 7: abdomen 3.0 mpr sag · sagittal · 0.59mm/px · 9 of 117 slices shown, 11 images]
[im 12/117  soft-tissue]
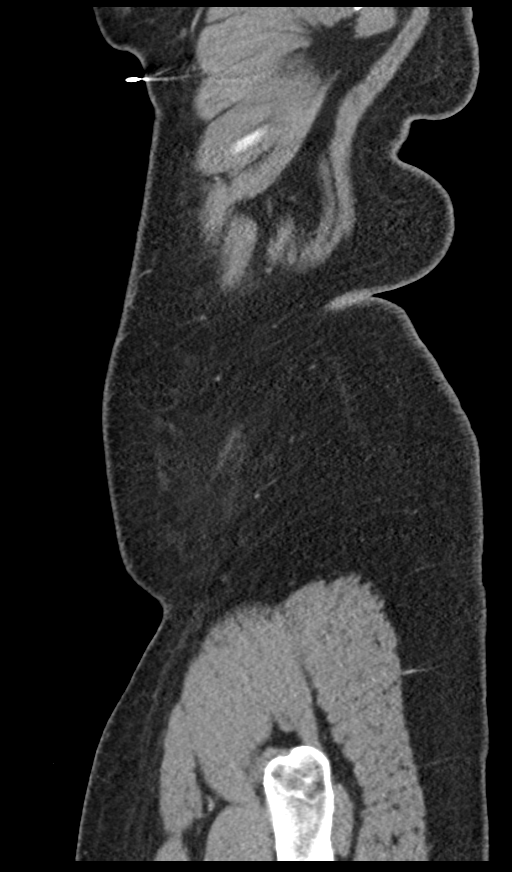
[im 12/117  bone]
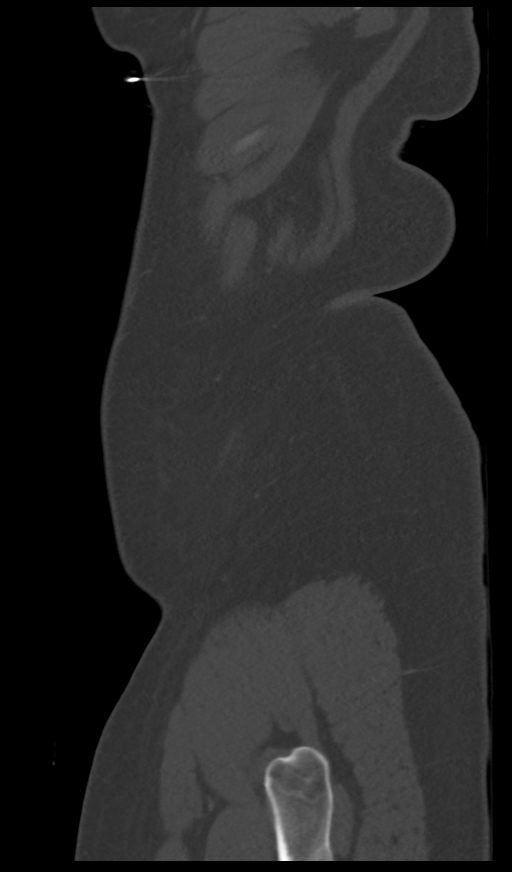
[im 34/117  soft-tissue]
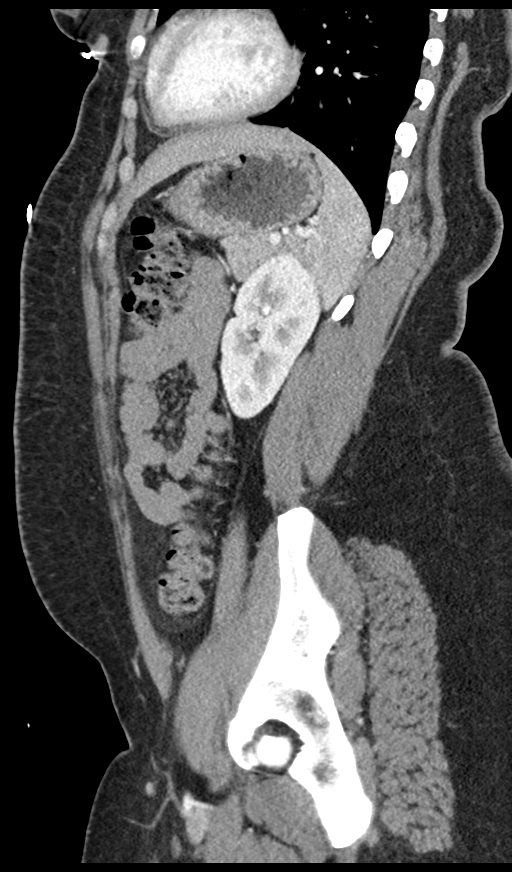
[im 49/117  soft-tissue]
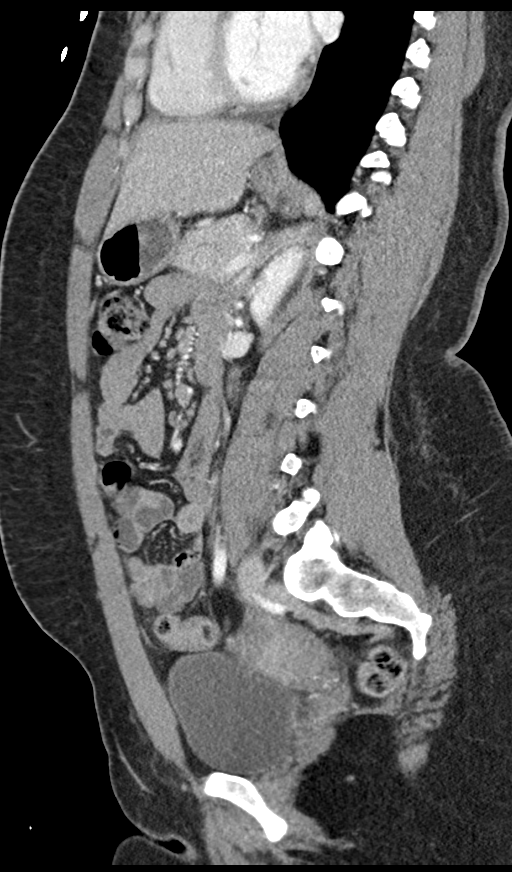
[im 68/117  soft-tissue]
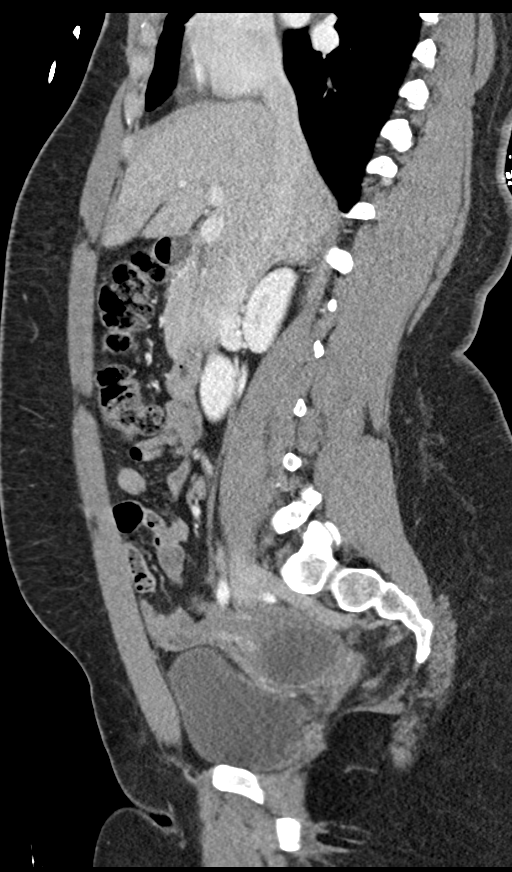
[im 87/117  soft-tissue]
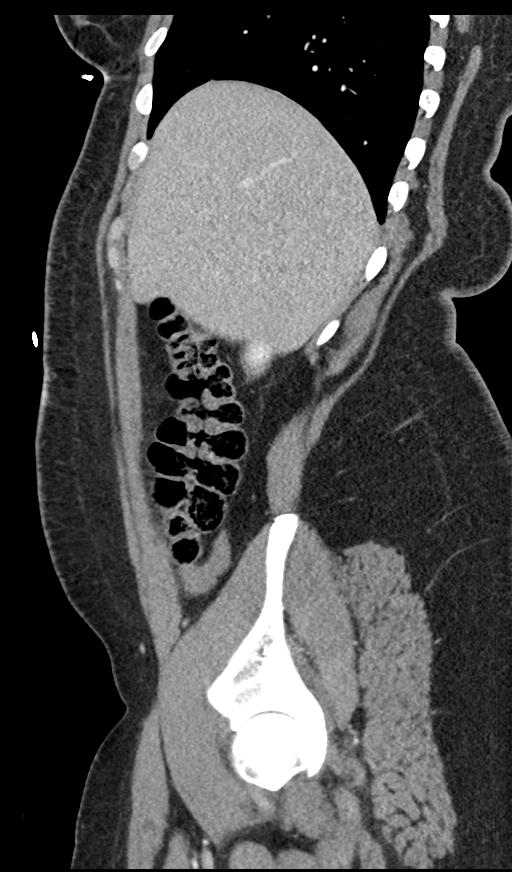
[im 102/117  lung]
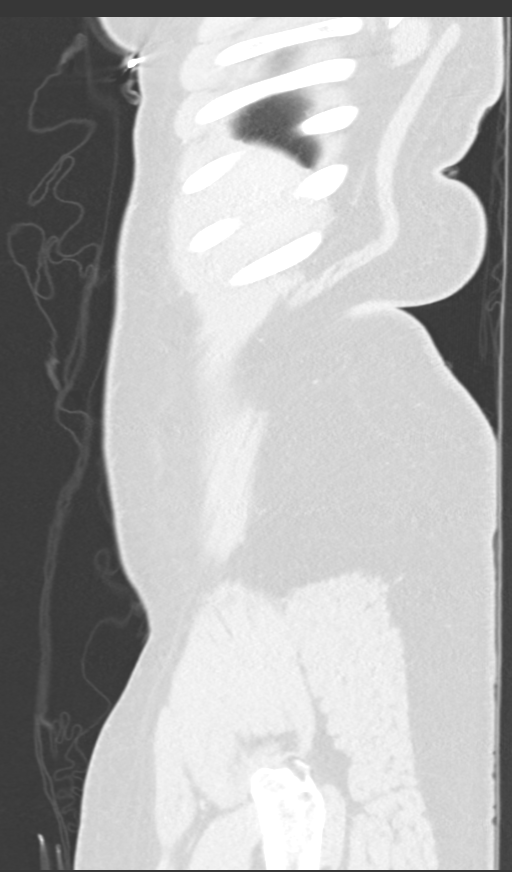
[im 105/117  soft-tissue]
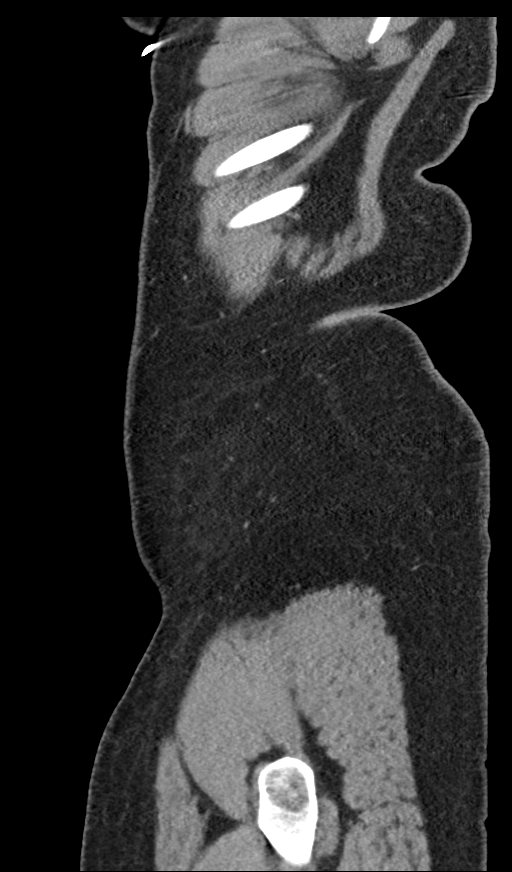
[im 105/117  lung]
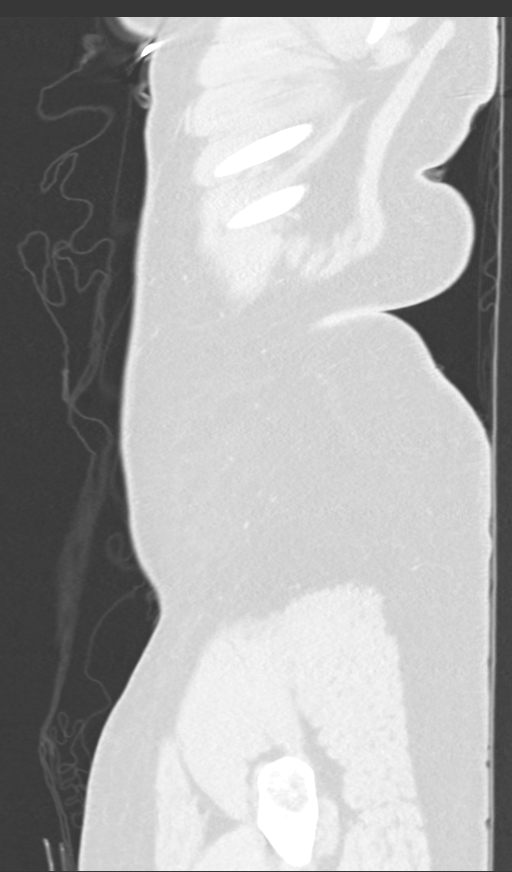
[im 109/117  lung]
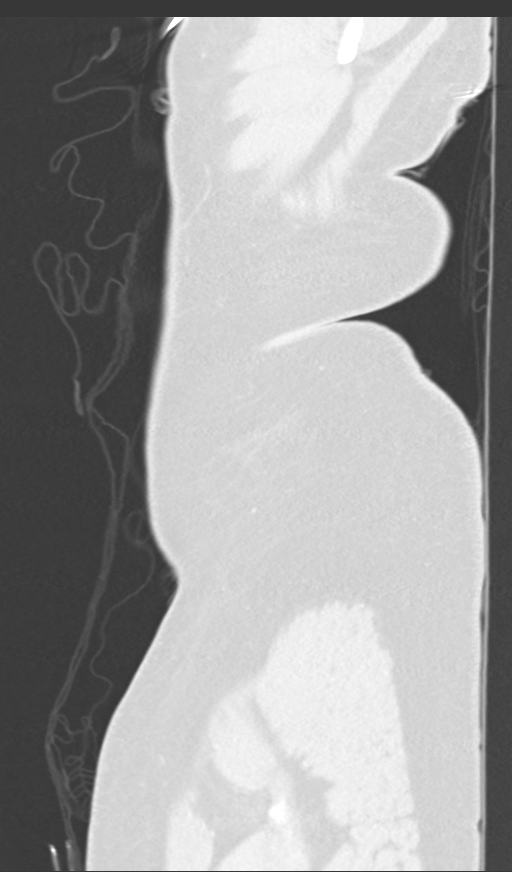
[im 113/117  lung]
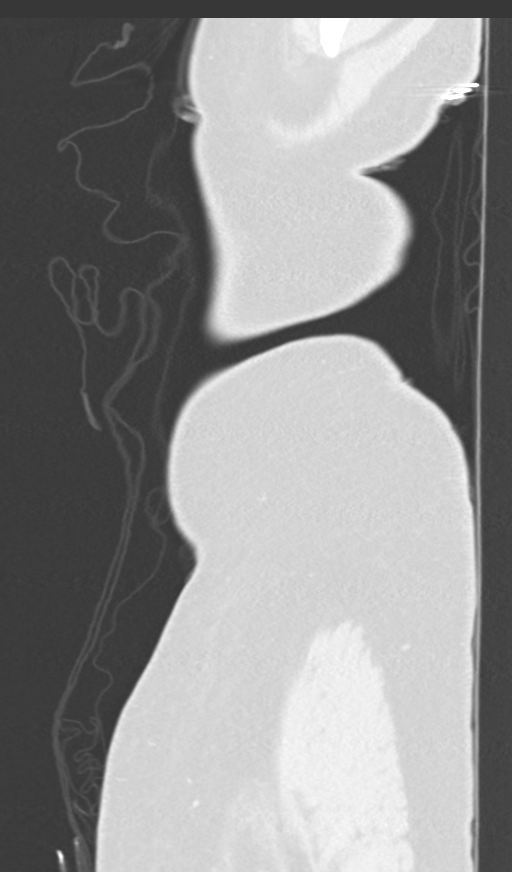

[9 of 34 positions shown; findings below may reference images not displayed]

FINDINGS: Lower chest: Lung bases are clear.

Hepatobiliary: No focal hepatic lesion. No biliary duct dilatation.
Gallbladder is normal. Common bile duct is normal.

Pancreas: Pancreas is normal. No ductal dilatation. No pancreatic
inflammation.

Spleen: Normal spleen

Adrenals/urinary tract: Adrenal glands and kidneys are normal. The
ureters and bladder normal.

Stomach/Bowel: Stomach, duodenum small-bowel normal. Appendix is
gas-filled extending from the cecal tip measuring 4 mm in diameter.
No wall thickening. The appendix is out line by a small amount of
fluid in the RIGHT lower quadrant (image 75/3). Ascending,
transverse and descending colon normal.

Vascular/Lymphatic: Abdominal aorta is normal caliber. No periportal
or retroperitoneal adenopathy. No pelvic adenopathy.

Reproductive: Uterus is normal. Large cyst in the RIGHT ovary
measuring 4.0 x 4.0 cm. Small amount free fluid the pelvis. Small
amount of free fluid in the RIGHT lower quadrant. LEFT ovary is
normal

Other: Small free fluid in the RIGHT lower quadrant

Musculoskeletal: No aggressive osseous lesion.
IMPRESSION: 1. Small amount of free fluid in the RIGHT lower quadrant and pelvis
is likely physiologic.
2. Large RIGHT ovarian cyst.  This could be a source of pain.
3. Appendix is out line by a small amount of fluid in the RIGHT
lower quadrant. No wall thickening or inflammation.
4. No nephrolithiasis or ureterolithiasis.

## 2020-06-30 IMAGING — US US PELVIS COMPLETE TRANSABD/TRANSVAG W DUPLEX
1 series · 13 of 25 positions shown · non-contrast
Comparison: CT same day

CLINICAL DATA: Abdominal pain. Lower abdominal pain greater on the
RIGHT for 1.5 weeks

EXAM:
TRANSABDOMINAL AND TRANSVAGINAL ULTRASOUND OF PELVIS
DOPPLER ULTRASOUND OF OVARIES
TECHNIQUE: Both transabdominal and transvaginal ultrasound examinations of the
pelvis were performed. Transabdominal technique was performed for
global imaging of the pelvis including uterus, ovaries, adnexal
regions, and pelvic cul-de-sac.
It was necessary to proceed with endovaginal exam following the
transabdominal exam to visualize the ovaries. Color and duplex
Doppler ultrasound was utilized to evaluate blood flow to the
ovaries.

[Series 1: us pelvis complete transabd/transvag w duplex · 122 acquisitions, 13 frames shown]
[im 1/122]
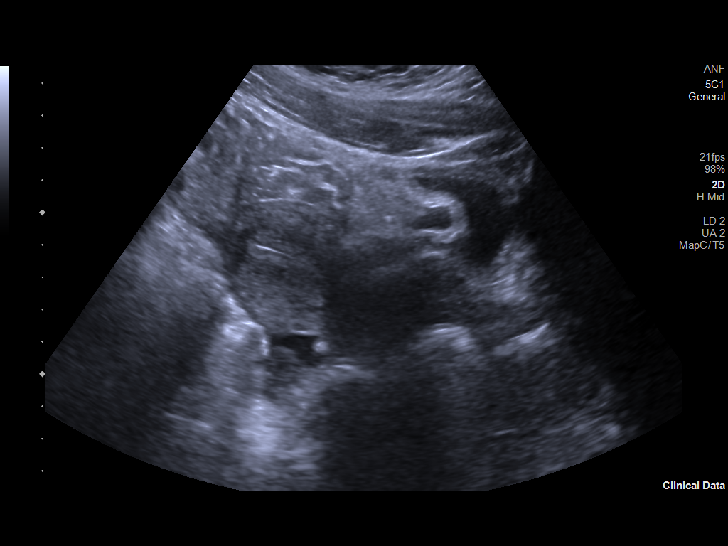
[im 11/122]
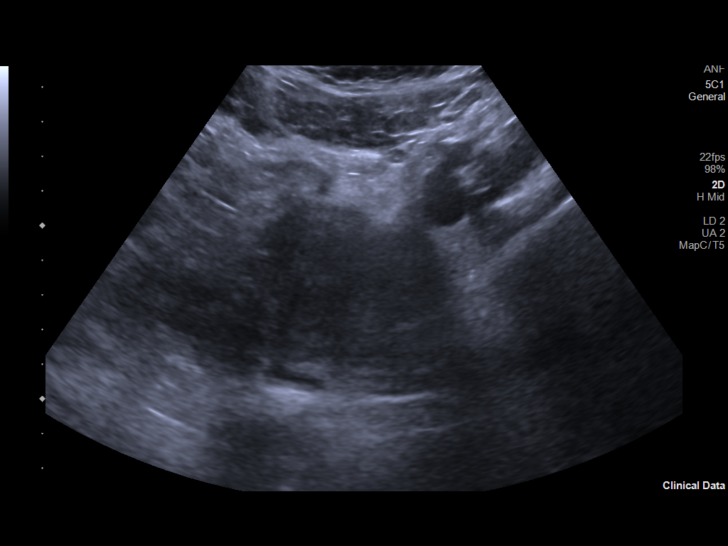
[im 21/122]
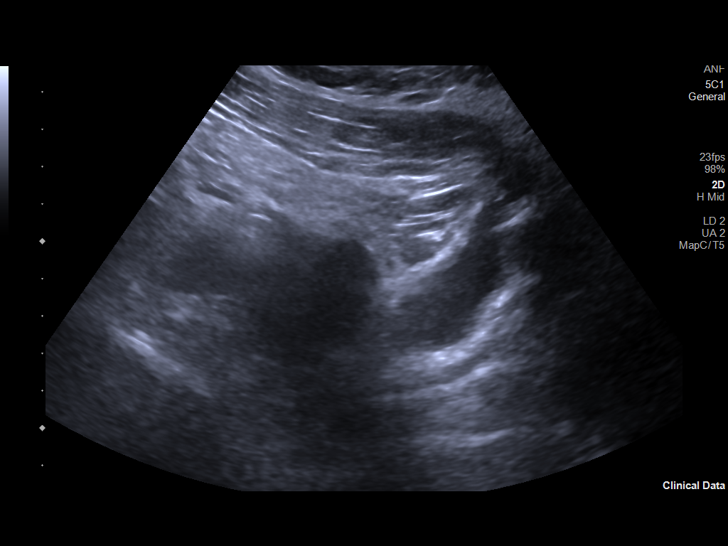
[im 31/122]
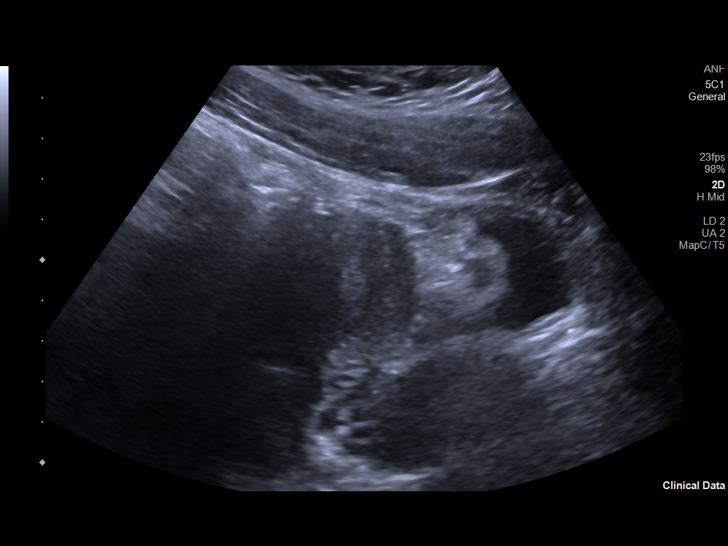
[im 41/122]
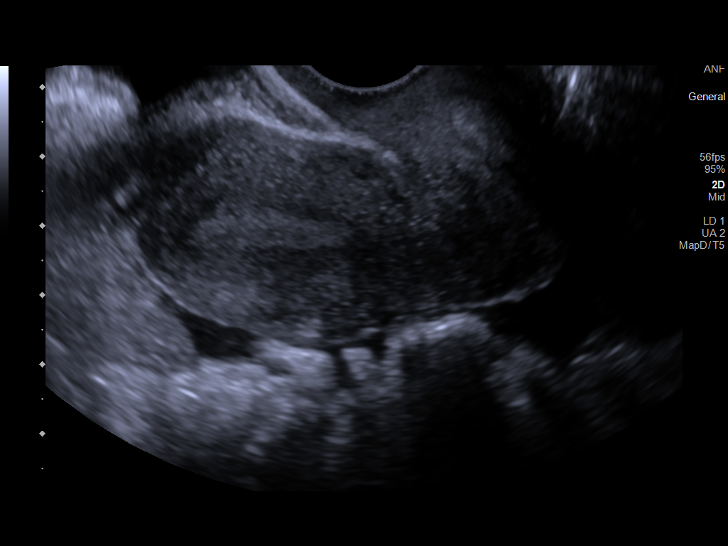
[im 51/122]
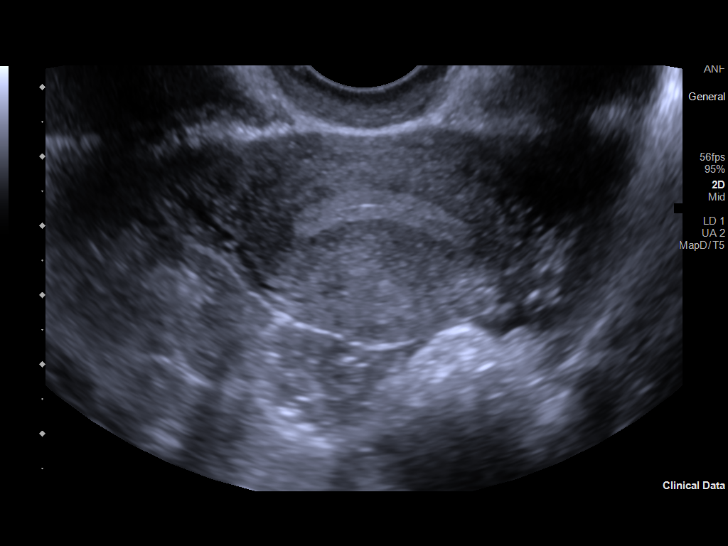
[im 61/122]
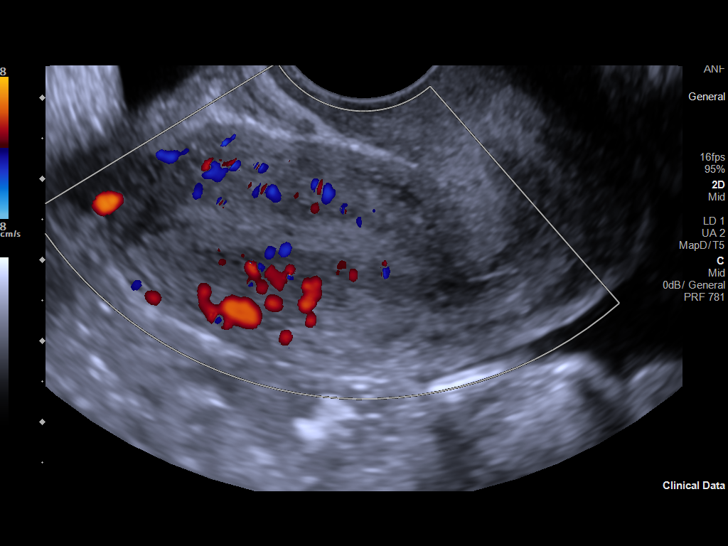
[im 71/122]
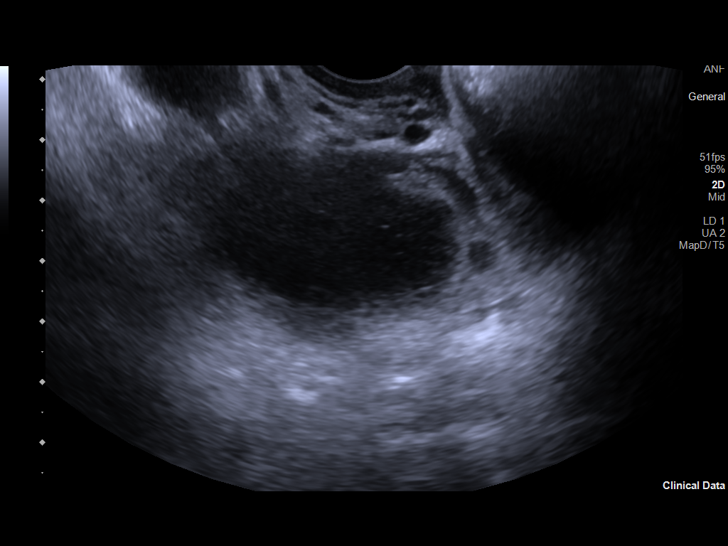
[im 81/122]
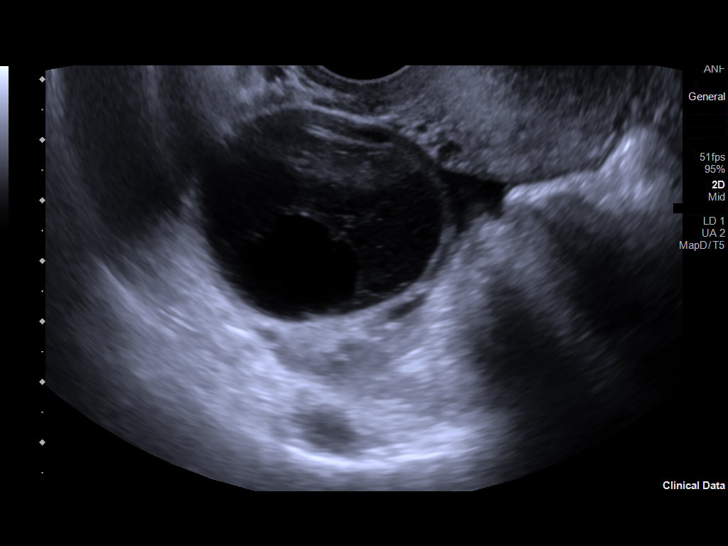
[im 91/122]
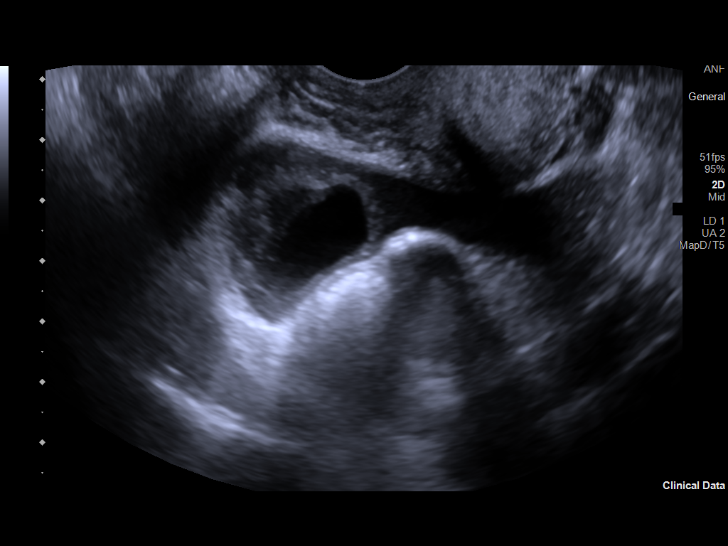
[im 101/122]
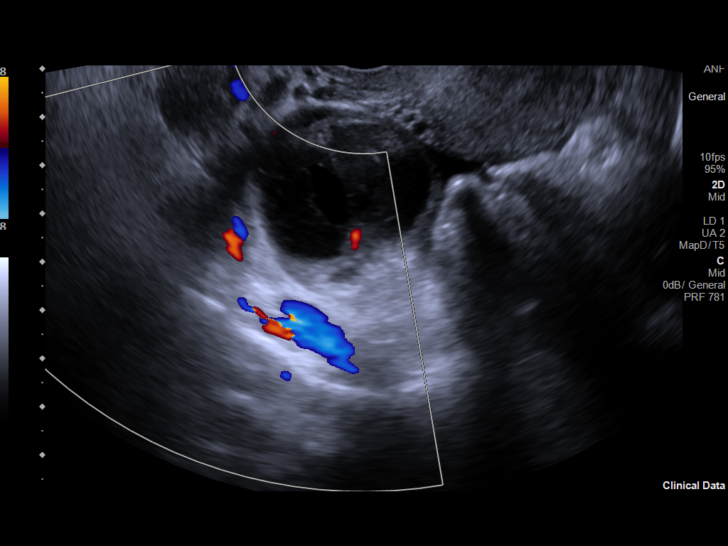
[im 111/122]
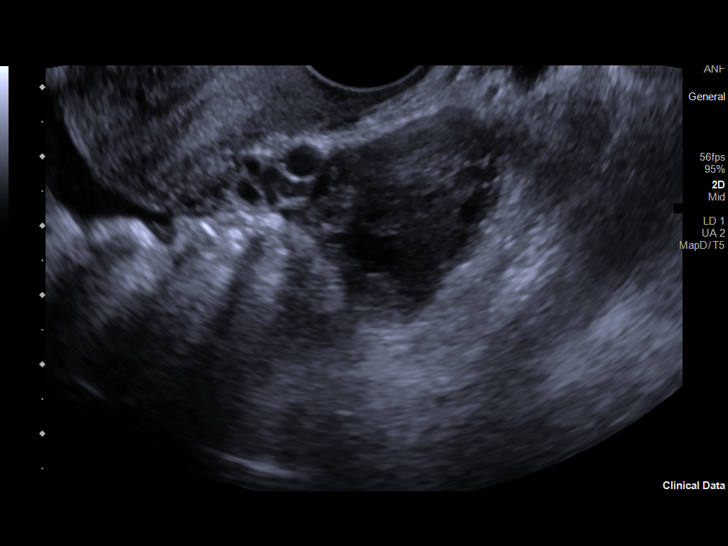
[im 122/122]
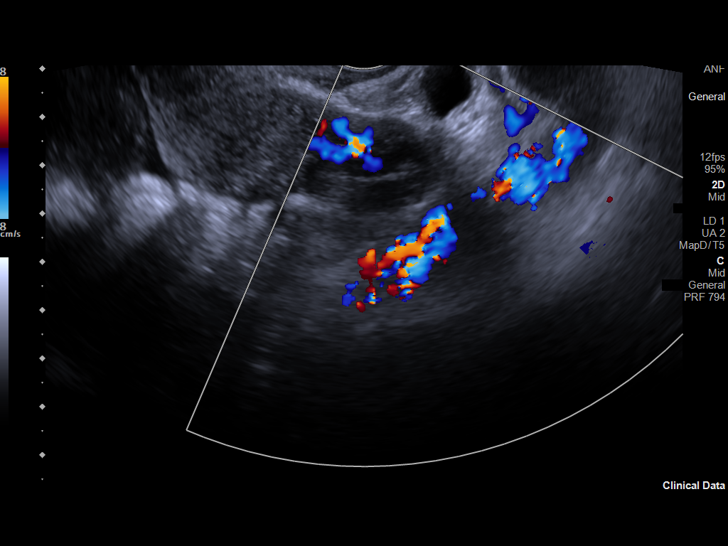

[13 of 25 positions shown; findings below may reference images not displayed]

FINDINGS: Uterus

Measurements: Normal in size at 6.3 x 3.1 x 5.1 cm. = volume: 53 mL.
No fibroids or other mass visualized. Anteverted uterus

Endometrium

Thickness: Normal thickness at 5.6 mm. No focal abnormality
visualized.

Right ovary

Measurements: 5.2 x 4.6 x 4.3 cm = volume: 55 mL. Complex cyst
within the RIGHT ovary measuring 4.8 x 3.2 x 4.0 cm. This cyst has
Shobha internal echoes and no significant vascular flow internally. No
clear solid components.

Left ovary

Measurements: 4.3 x 2.5 x 2.8 cm 16 = volume: 16 mL.  Normal

Pulsed Doppler evaluation of both ovaries demonstrates normal
low-resistance arterial and venous waveforms.

Other findings

Small free fluid the pelvis
IMPRESSION: 1. Complex cyst of the RIGHT ovary measuring up to 4.8 cm has
imaging characteristics most consistent benign hemorrhagic cyst. No
specific follow-up recommended for cysts less than 5 cm. If concern,
follow-up in ultrasound 6-12 weeks to ensure resolution could be
performed. This recommendation follows the consensus statement:
Management of Asymptomatic Ovarian and Other Adnexal Cysts Imaged at
US: Society of Radiologists in Ultrasound Consensus Conference
2. Normal LEFT ovary.
3. Normal uterus.
4. Small amount free fluid.
